# Patient Record
Sex: Female | Born: 1945 | Marital: Single
Health system: Southern US, Community
[De-identification: ages and names within clinical notes are randomized; demographics above are authoritative.]

---

## 2014-02-12 ENCOUNTER — Inpatient Hospital Stay: Payer: Self-pay | Admitting: Internal Medicine

## 2014-02-12 LAB — CBC WITH DIFFERENTIAL/PLATELET
BASOS PCT: 0.5 %
Basophil #: 0 10*3/uL (ref 0.0–0.1)
Eosinophil #: 0.2 10*3/uL (ref 0.0–0.7)
Eosinophil %: 3.1 %
HCT: 34.2 % — AB (ref 35.0–47.0)
HGB: 11.7 g/dL — AB (ref 12.0–16.0)
LYMPHS PCT: 16.9 %
Lymphocyte #: 1 10*3/uL (ref 1.0–3.6)
MCH: 32.2 pg (ref 26.0–34.0)
MCHC: 34 g/dL (ref 32.0–36.0)
MCV: 95 fL (ref 80–100)
MONO ABS: 0.7 x10 3/mm (ref 0.2–0.9)
Monocyte %: 12.6 %
Neutrophil #: 3.8 10*3/uL (ref 1.4–6.5)
Neutrophil %: 66.9 %
PLATELETS: 166 10*3/uL (ref 150–440)
RBC: 3.62 10*6/uL — ABNORMAL LOW (ref 3.80–5.20)
RDW: 16.6 % — ABNORMAL HIGH (ref 11.5–14.5)
WBC: 5.7 10*3/uL (ref 3.6–11.0)

## 2014-02-12 LAB — BODY FLUID CELL COUNT WITH DIFFERENTIAL
BASOS ABS: 0 %
EOS PCT: 0 %
LYMPHS PCT: 16 %
NUCLEATED CELL COUNT: 119 /mm3
Neutrophils: 38 %
OTHER MONONUCLEAR CELLS: 46 %
Other Cells BF: 0 %

## 2014-02-12 LAB — COMPREHENSIVE METABOLIC PANEL
ALK PHOS: 130 U/L — AB
Albumin: 1.1 g/dL — ABNORMAL LOW (ref 3.4–5.0)
Anion Gap: 4 — ABNORMAL LOW (ref 7–16)
BILIRUBIN TOTAL: 1.3 mg/dL — AB (ref 0.2–1.0)
BUN: 7 mg/dL (ref 7–18)
CALCIUM: 7.6 mg/dL — AB (ref 8.5–10.1)
CHLORIDE: 96 mmol/L — AB (ref 98–107)
Co2: 30 mmol/L (ref 21–32)
Creatinine: 0.82 mg/dL (ref 0.60–1.30)
EGFR (African American): >60
Glucose: 77 mg/dL (ref 65–99)
Osmolality: 258 (ref 275–301)
Potassium: 4.4 mmol/L (ref 3.5–5.1)
SGOT(AST): 47 U/L — ABNORMAL HIGH (ref 15–37)
SGPT (ALT): 22 U/L (ref 12–78)
SODIUM: 130 mmol/L — AB (ref 136–145)
Total Protein: 5.6 g/dL — ABNORMAL LOW (ref 6.4–8.2)

## 2014-02-12 LAB — LIPID PANEL
CHOLESTEROL: 207 mg/dL — AB (ref 0–200)
HDL Cholesterol: 26 mg/dL — ABNORMAL LOW (ref 40–60)
Ldl Cholesterol, Calc: 160 mg/dL — ABNORMAL HIGH (ref 0–100)
Triglycerides: 106 mg/dL (ref 0–200)
VLDL Cholesterol, Calc: 21 mg/dL (ref 5–40)

## 2014-02-12 LAB — PROTEIN, BODY FLUID: Protein, Body Fluid: 0.5 g/dL

## 2014-02-12 LAB — TROPONIN I
Troponin-I: <0.02 ng/mL
Troponin-I: <0.02 ng/mL
Troponin-I: <0.02 ng/mL

## 2014-02-12 LAB — APTT: ACTIVATED PTT: 39.2 s — AB (ref 23.6–35.9)

## 2014-02-12 LAB — MAGNESIUM: Magnesium: 2.1 mg/dL

## 2014-02-12 LAB — LACTATE DEHYDROGENASE, PLEURAL OR PERITONEAL FLUID: LDH, BODY FLUID: 29 U/L

## 2014-02-12 LAB — PROTIME-INR
INR: 1.3
Prothrombin Time: 16.1 secs — ABNORMAL HIGH (ref 11.5–14.7)

## 2014-02-12 LAB — LIPASE, BLOOD: LIPASE: 237 U/L (ref 73–393)

## 2014-02-12 LAB — TSH: Thyroid Stimulating Horm: 31.6 u[IU]/mL — ABNORMAL HIGH

## 2014-02-12 LAB — HEMOGLOBIN: HGB: 11.6 g/dL — ABNORMAL LOW (ref 12.0–16.0)

## 2014-02-12 LAB — ALBUMIN, FLUID (OTHER): Body Fluid Albumin: <0.6 g/dL

## 2014-02-12 LAB — AMMONIA: AMMONIA, PLASMA: 55 umol/L — AB (ref 11–32)

## 2014-02-12 LAB — GLUCOSE, SEROUS FLUID: GLUCOSE, BODY FLUID: 87 mg/dL

## 2014-02-13 LAB — COMPREHENSIVE METABOLIC PANEL
ALT: 18 U/L (ref 12–78)
AST: 34 U/L (ref 15–37)
Albumin: 1.3 g/dL — ABNORMAL LOW (ref 3.4–5.0)
Alkaline Phosphatase: 107 U/L
Anion Gap: 2 — ABNORMAL LOW (ref 7–16)
BILIRUBIN TOTAL: 0.9 mg/dL (ref 0.2–1.0)
BUN: 9 mg/dL (ref 7–18)
CREATININE: 0.83 mg/dL (ref 0.60–1.30)
Calcium, Total: 7.6 mg/dL — ABNORMAL LOW (ref 8.5–10.1)
Chloride: 97 mmol/L — ABNORMAL LOW (ref 98–107)
Co2: 31 mmol/L (ref 21–32)
EGFR (African American): >60
EGFR (Non-African Amer.): >60
Glucose: 85 mg/dL (ref 65–99)
OSMOLALITY: 259 (ref 275–301)
Potassium: 4 mmol/L (ref 3.5–5.1)
Sodium: 130 mmol/L — ABNORMAL LOW (ref 136–145)
Total Protein: 4.8 g/dL — ABNORMAL LOW (ref 6.4–8.2)

## 2014-02-13 LAB — CBC WITH DIFFERENTIAL/PLATELET
Basophil #: 0 10*3/uL (ref 0.0–0.1)
Basophil %: 0.3 %
EOS PCT: 4 %
Eosinophil #: 0.2 10*3/uL (ref 0.0–0.7)
HCT: 30.3 % — ABNORMAL LOW (ref 35.0–47.0)
HGB: 10 g/dL — AB (ref 12.0–16.0)
LYMPHS ABS: 0.8 10*3/uL — AB (ref 1.0–3.6)
Lymphocyte %: 19.4 %
MCH: 31.6 pg (ref 26.0–34.0)
MCHC: 33.1 g/dL (ref 32.0–36.0)
MCV: 96 fL (ref 80–100)
MONO ABS: 0.5 x10 3/mm (ref 0.2–0.9)
Monocyte %: 12 %
NEUTROS PCT: 64.3 %
Neutrophil #: 2.7 10*3/uL (ref 1.4–6.5)
Platelet: 127 10*3/uL — ABNORMAL LOW (ref 150–440)
RBC: 3.17 10*6/uL — ABNORMAL LOW (ref 3.80–5.20)
RDW: 17.4 % — ABNORMAL HIGH (ref 11.5–14.5)
WBC: 4.2 10*3/uL (ref 3.6–11.0)

## 2014-02-13 LAB — URINALYSIS, COMPLETE
Bilirubin,UR: NEGATIVE
GLUCOSE, UR: NEGATIVE mg/dL (ref 0–75)
Ketone: NEGATIVE
Nitrite: NEGATIVE
Ph: 6 (ref 4.5–8.0)
Protein: 100
RBC,UR: 10 /HPF (ref 0–5)
Specific Gravity: 1.013 (ref 1.003–1.030)
WBC UR: 24 /HPF (ref 0–5)

## 2014-02-14 LAB — BASIC METABOLIC PANEL
Anion Gap: 4 — ABNORMAL LOW (ref 7–16)
BUN: 9 mg/dL (ref 7–18)
CALCIUM: 7.7 mg/dL — AB (ref 8.5–10.1)
CHLORIDE: 100 mmol/L (ref 98–107)
Co2: 28 mmol/L (ref 21–32)
Creatinine: 0.82 mg/dL (ref 0.60–1.30)
EGFR (Non-African Amer.): >60
Glucose: 75 mg/dL (ref 65–99)
OSMOLALITY: 262 (ref 275–301)
Potassium: 4 mmol/L (ref 3.5–5.1)
Sodium: 132 mmol/L — ABNORMAL LOW (ref 136–145)

## 2014-02-14 LAB — MAGNESIUM: MAGNESIUM: 1.9 mg/dL

## 2014-02-14 LAB — AFP TUMOR MARKER: AFP-Tumor Marker: 0.8 ng/mL (ref 0.0–8.3)

## 2014-02-15 LAB — BASIC METABOLIC PANEL
Anion Gap: 6 — ABNORMAL LOW (ref 7–16)
BUN: 11 mg/dL (ref 7–18)
Calcium, Total: 7.5 mg/dL — ABNORMAL LOW (ref 8.5–10.1)
Chloride: 101 mmol/L (ref 98–107)
Co2: 28 mmol/L (ref 21–32)
Creatinine: 0.87 mg/dL (ref 0.60–1.30)
EGFR (African American): >60
Glucose: 85 mg/dL (ref 65–99)
OSMOLALITY: 269 (ref 275–301)
POTASSIUM: 4 mmol/L (ref 3.5–5.1)
Sodium: 135 mmol/L — ABNORMAL LOW (ref 136–145)

## 2014-02-15 LAB — MAGNESIUM: MAGNESIUM: 1.9 mg/dL

## 2014-02-16 LAB — BODY FLUID CULTURE

## 2014-02-16 LAB — BASIC METABOLIC PANEL
ANION GAP: 5 — AB (ref 7–16)
BUN: 10 mg/dL (ref 7–18)
CALCIUM: 7.2 mg/dL — AB (ref 8.5–10.1)
CREATININE: 0.84 mg/dL (ref 0.60–1.30)
Chloride: 104 mmol/L (ref 98–107)
Co2: 29 mmol/L (ref 21–32)
EGFR (Non-African Amer.): >60
GLUCOSE: 66 mg/dL (ref 65–99)
Osmolality: 273 (ref 275–301)
Potassium: 4 mmol/L (ref 3.5–5.1)
Sodium: 138 mmol/L (ref 136–145)

## 2014-02-19 LAB — CBC WITH DIFFERENTIAL/PLATELET
Basophil #: 0 10*3/uL (ref 0.0–0.1)
Basophil %: 0.7 %
EOS ABS: 0.1 10*3/uL (ref 0.0–0.7)
Eosinophil %: 1.8 %
HCT: 25.3 % — AB (ref 35.0–47.0)
HGB: 8.6 g/dL — ABNORMAL LOW (ref 12.0–16.0)
LYMPHS ABS: 1.2 10*3/uL (ref 1.0–3.6)
Lymphocyte %: 19.5 %
MCH: 32.9 pg (ref 26.0–34.0)
MCHC: 34.2 g/dL (ref 32.0–36.0)
MCV: 96 fL (ref 80–100)
MONO ABS: 0.7 x10 3/mm (ref 0.2–0.9)
Monocyte %: 10.7 %
NEUTROS PCT: 67.3 %
Neutrophil #: 4.2 10*3/uL (ref 1.4–6.5)
Platelet: 128 10*3/uL — ABNORMAL LOW (ref 150–440)
RBC: 2.63 10*6/uL — ABNORMAL LOW (ref 3.80–5.20)
RDW: 17.8 % — ABNORMAL HIGH (ref 11.5–14.5)
WBC: 6.3 10*3/uL (ref 3.6–11.0)

## 2014-02-19 LAB — COMPREHENSIVE METABOLIC PANEL
ALT: 15 U/L (ref 12–78)
Albumin: 2.7 g/dL — ABNORMAL LOW (ref 3.4–5.0)
Alkaline Phosphatase: 68 U/L
Anion Gap: 5 — ABNORMAL LOW (ref 7–16)
BUN: 16 mg/dL (ref 7–18)
Bilirubin,Total: 1.2 mg/dL — ABNORMAL HIGH (ref 0.2–1.0)
CALCIUM: 8 mg/dL — AB (ref 8.5–10.1)
CHLORIDE: 105 mmol/L (ref 98–107)
CO2: 29 mmol/L (ref 21–32)
Creatinine: 1.07 mg/dL (ref 0.60–1.30)
EGFR (African American): >60
GFR CALC NON AF AMER: 54 — AB
Glucose: 90 mg/dL (ref 65–99)
OSMOLALITY: 278 (ref 275–301)
Potassium: 4.5 mmol/L (ref 3.5–5.1)
SGOT(AST): 33 U/L (ref 15–37)
Sodium: 139 mmol/L (ref 136–145)
Total Protein: 4.8 g/dL — ABNORMAL LOW (ref 6.4–8.2)

## 2014-02-19 LAB — HEMOGLOBIN: HGB: 8.1 g/dL — AB (ref 12.0–16.0)

## 2014-02-20 LAB — HEMOGLOBIN: HGB: 8.1 g/dL — ABNORMAL LOW (ref 12.0–16.0)

## 2015-01-31 NOTE — Consult Note (Signed)
Chief Complaint:  Subjective/Chief Complaint seen for cirrhosis.  stable, s/p 2 liter paracentesis today.   denies n.v, tolerating regular po. brown stools recorded.   VITAL SIGNS/ANCILLARY NOTES: **Vital Signs.:   08-May-15 14:25  Vital Signs Type Routine  Temperature Temperature (F) 98  Celsius 36.6  Pulse Pulse 59  Respirations Respirations 20  Systolic BP Systolic BP 84  Diastolic BP (mmHg) Diastolic BP (mmHg) 52  Mean BP 62  Pulse Ox % Pulse Ox % 98  Pulse Ox Activity Level  At rest  Oxygen Delivery Room Air/ 21 %  *Intake and Output.:   08-May-15 14:30  Stool  1 brown formed moderate amount   Brief Assessment:  Cardiac Regular   Respiratory clear BS   Gastrointestinal details normal Soft  Nontender  Bowel sounds normal  positive ascites   Lab Results: Routine Chem:  08-May-15 04:37   Glucose, Serum 75  BUN 9  Creatinine (comp) 0.82  Sodium, Serum  132  Potassium, Serum 4.0  Chloride, Serum 100  CO2, Serum 28  Calcium (Total), Serum  7.7  Anion Gap  4  Osmolality (calc) 262  eGFR (African American) >60  eGFR (Non-African American) >60 (eGFR values <28m/min/1.73 m2 may be an indication of chronic kidney disease (CKD). Calculated eGFR is useful in patients with stable renal function. The eGFR calculation will not be reliable in acutely ill patients when serum creatinine is changing rapidly. It is not useful in  patients on dialysis. The eGFR calculation may not be applicable to patients at the low and high extremes of body sizes, pregnant women, and vegetarians.)  Magnesium, Serum 1.9 (1.8-2.4 THERAPEUTIC RANGE: 4-7 mg/dL TOXIC: > 10 mg/dL  -----------------------)   Radiology Results: [place]    08-May-15 16:14, [place]  [place]acent[place]   REASON FOR EXAM:    ascites  COMMENTS:       PROCEDURE: [place] - [place]UIDED PARACENTESIS  - Feb 14 2014  4:14PM     INDICATION:  Recurrent symptomatic ascites    EXAM:  ULTRASOUND-GUIDED  PARACENTESIS    COMPARISON:  [place]DOMEN COMPLETE dated 02/12/2014; USGUIDE NEEDLE -  [place]PARA dated 02/12/2014    MEDICATIONS:  None.    COMPLICATIONS:  None immediate    TECHNIQUE:  Informed written consent was obtained from the patient (via the use  of a medical translator) after a discussion of the risks, benefits  and alternatives to treatment. A timeout was performed prior to the  initiation of the procedure.    Initial ultrasound scanning demonstrates a small amount of ascites  within the right lower abdominal quadrant. The right lower abdomen  was prepped and draped in the usual sterile fashion. 1% lidocaine  with epinephrine was used for local anesthesia. An ultrasound image  was saved for documentation purposed. An 8 Fr Safe-T-Centesis  catheter was introduced. The paracentesis was performed. The  catheter was removed and a dressing was applied. The patient  tolerated the procedure well without immediate post procedural  complication.    FINDINGS:  A total of approximately 2 liters of serous fluid was removed.     IMPRESSION:  Successful ultrasound-guided paracentesis yielding 2 liters of  peritoneal fluid.      Electronically Signed    By: JSandi MariscalM.D.    On: 02/14/2014 16:19         Verified By: JAileen Fass M.D.,  Cardiology:    07-May-15 18:54, Echo Doppler  Echo Doppler  REASON FOR EXAM:      COMMENTS:       PROCEDURE: [place] - ECHO DOPPLER COMPLETE(TRANSTHOR)  - Feb 13 2014  6:54PM     RESULT: Echocardiogram Report    Patient [facility]:   [facility] Date of Exam: 02/13/2014  Medical Rec #:  [id number]                       Custom1:  Date of Birth:  [birth year]                          Height:       61.0 in  Patient Age:    69 years                           Weight:       179.0 lb  Patient Gender: F                                  BSA:          1.80 m??    Indications: SOB  Sonographer:    Arville Go RDCS  Referring Phys:  Ether Griffins, [place]    Summary:   1. Left ventricular ejection fraction, by visual estimation, is 60 to   65%.   2. Mildly dilated left atrium.   3. Moderate mitral valve regurgitation.   4. Mild tricuspid regurgitation.  2D AND M-MODE MEASUREMENTS (normal ranges within parentheses):  Left Ventricle:          Normal  IVSd (2D):      0.74 cm (0.7-1.1)  LVPWd (2D):     0.88 cm (0.7-1.1) Aorta/LA:                  Normal  LVIDd (2D):     4.97 cm (3.4-5.7) Aortic Root (2D): 2.90 cm (2.4-3.7)  LVIDs (2D):     3.08 cm           Left Atrium (2D): 3.60 cm (1.9-4.0)  LV FS (2D):     38.0 %   (>25%)  LV EF (2D):     68.0 %   (>50%)                                    Right Ventricle:                                    RVd (2D):  LV DIASTOLIC FUNCTION:  MV Peak E: 1.11 m/s Decel Time: 454 msec  MV Peak A: 0.95 m/s  E/A Ratio: 1.17  SPECTRAL DOPPLER ANALYSIS (where applicable):  Mitral Valve:  MV P1/2 Time: 131.66 msec  MV Area,PHT: 1.67 cm??  Aortic Valve: AoV Max Vel: 2.04 m/s AoV Peak PG: 16.6 mmHg AoV Mean PG:  LVOT Vmax: 1.47 m/s LVOT VTI:  LVOT Diameter: 2.00 cm  AoV Area, Vmax: 2.26 cm?? AoV Area, VTI:  AoV Area, Vmn:  Tricuspid Valve and PA/RV Systolic Pressure: TR Max Velocity: 2.38 m/s RA     Pressure: 5 mmHg RVSP/PASP: 27.7 mmHg  Pulmonic Valve:  PV Max Velocity: 1.44 m/s PV Max PG: 8.3 mmHg PV Mean PG:  PHYSICIAN INTERPRETATION:  Left Ventricle: The left ventricular internal cavity size was normal. LV   posterior wall thickness was normal. Left ventricular ejection fraction,   by visual estimation, is 60 to 65%.  Right Ventricle: Normal right ventricular size, wall thickness, and   systolic function. RV wall thickness is normal.  Left Atrium: The left atrium is mildly dilated.  Right Atrium: The right atrium is normal in size and structure.  Pericardium: There is no evidence of pericardial effusion.  Mitral Valve: Moderate mitral valve regurgitation is seen.  Tricuspid Valve:  Mild tricuspid regurgitation is visualized. The     tricuspid regurgitant velocity is 2.38 m/s, and with an assumed right   atrial pressure of 5 mmHg, the estimated right ventricular systolic   pressure is normal at 27.7 mmHg.  Aortic Valve: The aortic valve is trileaflet and structurally normal,   with normal leaflet excursion; without any evidence of aortic stenosis or   insufficiency.  Pulmonic Valve: Structurally normal pulmonic valve, with normal leaflet   excursion.  Aorta: The aortic root and ascending aorta are structurally normal, with   no evidence of dilitation.    [id number] Serafina Royals MD  Electronically signed by [id number] Serafina Royals MD  Signature Date/Time: 02/14/2014/4:15:19 PM  *** Final ***    IMPRESSION: .        Verified By: Corey Skains  ([facility]), M.D., MD   Assessment/Plan:  Assessment/Plan:  Assessment 1) cirrhosis with mild coagulopathy hypoalbuminemia, mild increase bili, mild hyperammonemia. Ardine Eng pugh class b-c.  uncertain etiology.  continues with hypoalbuminemia, contributing to paripheral edema and ascites.  Increase weight since admission.  New history indicates she was told in [place] that her liver disease was due to medicines, but she is uncertain what.  Evaluation here otherwise negative.   Plan 1) continue current, may need to limit fluids to 1/5-2 liters per day.  increase diuretices slowly to achieve 1.5 to 2 # weight loss a day.  ct abd to be done over the weekend. Dr Vira Agar covering until monday.   Electronic Signatures: Loistine Simas (MD)  (Signed [phone] 17:36)  Authored: Chief Complaint, VITAL SIGNS/ANCILLARY NOTES, Brief Assessment, Lab Results, Radiology Results, Assessment/Plan   Last Updated: 08-May-15 17:36 by Loistine Simas (MD)

## 2015-01-31 NOTE — Consult Note (Signed)
Pt complains of vomiting this morning after getting 4 oral meds. Nurse confirms this.  Will give iv Zofran before morning meds starting tomorrow.  Pt with abd with ascites and leg edema, will bump her lasix up to 40 mg bid and check daily weights. Alb 1.3  Complains of abd pain and swelling. BP runs low, afebrile. Pt CC is cirrhosis etiology unknown.  Electronic Signatures: Scot JunElliott, Kevon Tench T (MD)  (Signed on 09-May-15 14:19)  Authored  Last Updated: 09-May-15 14:19 by Scot JunElliott, Christyanna Mckeon T (MD)

## 2015-01-31 NOTE — Consult Note (Signed)
Patient had drop in BP during night, possible due to bump in diuretics of lasix to bid.  lasix and aldactone on hold.  BP back up to 93 systolic.  Still with trace leg edema.  Hospitalist to bump up midodrine.  No further recommendations.  Electronic Signatures: Scot JunElliott, Robert T (MD)  (Signed on 10-May-15 13:19)  Authored  Last Updated: 10-May-15 13:19 by Scot JunElliott, Robert T (MD)

## 2015-01-31 NOTE — Consult Note (Signed)
Chief Complaint:  Subjective/Chief Complaint seen for cirrhosis, ascites. Patietn c/o pain on the flanks mostly right.  no n/v, tolerating po.   VITAL SIGNS/ANCILLARY NOTES: **Vital Signs.:   11-May-15 09:12  Vital Signs Type Routine  Temperature Temperature (F) 98.5  Celsius 36.9  Temperature Source oral  Pulse Pulse 64  Respirations Respirations 20  Systolic BP Systolic BP 92  Diastolic BP (mmHg) Diastolic BP (mmHg) 54  Mean BP 66  Pulse Ox % Pulse Ox % 94  Pulse Ox Activity Level  At rest   Brief Assessment:  Cardiac Regular   Respiratory clear BS   Gastrointestinal details normal Bowel sounds normal  positive ascites,  mild lateral abdominal discomfort   Lab Results: Routine Chem:  10-May-15 05:49   Glucose, Serum 66  BUN 10  Creatinine (comp) 0.84  Sodium, Serum 138  Potassium, Serum 4.0  Chloride, Serum 104  CO2, Serum 29  Calcium (Total), Serum  7.2  Anion Gap  5  Osmolality (calc) 273  eGFR (African American) >60  eGFR (Non-African American) >60 (eGFR values <23m/min/1.73 m2 may be an indication of chronic kidney disease (CKD). Calculated eGFR is useful in patients with stable renal function. The eGFR calculation will not be reliable in acutely ill patients when serum creatinine is changing rapidly. It is not useful in  patients on dialysis. The eGFR calculation may not be applicable to patients at the low and high extremes of body sizes, pregnant women, and vegetarians.)   Radiology Results: CT:    09-May-15 15:22, CT Abdomen and Pelvis With Contrast  CT Abdomen and Pelvis With Contrast   REASON FOR EXAM:    (1) abdominal pain; (2) abdominal pain  COMMENTS:       PROCEDURE: CT  - CT ABDOMEN / PELVIS  W  - Feb 15 2014  3:22PM     CLINICAL DATA:  Abdominal pain and tenderness. Ascites. Recent  hernia repair.    EXAM:  CT ABDOMEN AND PELVIS WITH CONTRAST    TECHNIQUE:  Multidetector CT imaging of the abdomen and pelvis was performed  using the  standard protocol following bolus administration of  intravenous contrast.  CONTRAST:  100 mL Isovue 300    COMPARISON:  Ultrasound on 02/12/2014    FINDINGS:  Hepatic cirrhosis demonstrated, however no liver masses are  identified. Prominent esophageal varices are seen, consistent with  portal venous hypertension. No evidence of splenomegaly. Moderate  ascites is seen as well as diffuse body wall edema.    Calcified gallstones are noted, however there is no evidence of  acute cholecystitis or biliary dilatation. The pancreas, spleen,  adrenal glands, and kidneys are normal in appearance. No evidence of  hydronephrosis.  Uterus and adnexal regions are unremarkable. No soft tissue masses  or lymphadenopathy identified within the abdomen or pelvis. No  evidence of focal inflammatory process or abscess. No evidence of  dilated bowel loops.     IMPRESSION:  Hepatic cirrhosis with prominent esophageal varices, consistent with  portal venous hypertension.    Moderate ascites and diffuse body wall edema. No evidence of  splenomegaly.    Cholelithiasis.  No radiographic evidence of cholecystitis.    No soft tissue masses or lymphadenopathy identified within the  abdomen or pelvis.      Electronically Signed    By: JEarle GellM.D.    On: 02/15/2014 15:28         Verified By: JMarlaine Hind M.D.,   Assessment/Plan:  Assessment/Plan:  Assessment 1) ESLDz- cirrhosis, uncertain etiology, per patietn she has been told it is due to previous medications/herbals in [place].  Evaluation for etiology here uninformative. Patietn with esophageal varices per CT, ascites mild coagulopathy, mild hyperammonemia, hypoalbuminemia.  Ascites, now s/p paracentesis times 2.   Plan 1) continue current, recommend 2 liter daily fluid restriction, continue daily weights and strict I/O, continue spironolactone and lasix combination (hypotension noted over the weekend with second dose of lasix).   diuresis to attain 1.5 # loss daily in the hospital.  continue cipro prophylaxis for sbp twice a week.  As the bp is low, prophylaxis for variceal bleeding is more problematic.   Electronic Signatures: Loistine Simas (MD)  (Signed 11-May-15 17:08)  Authored: Chief Complaint, VITAL SIGNS/ANCILLARY NOTES, Brief Assessment, Lab Results, Radiology Results, Assessment/Plan   Last Updated: 11-May-15 17:08 by Loistine Simas (MD)

## 2015-01-31 NOTE — Consult Note (Signed)
Brief Consult Note: Diagnosis: cirrhosis.   Patient was seen by consultant.   Consult note dictated.   Recommend further assessment or treatment.   Orders entered.   Comments: Please see full GI consult [phone]#[id number]. Pateitn seen and examined, chart reviewed.  Patient presenting with ascites and probable cirrhosis per ultrasound.  Positive h/o diagnosis outside if the country.  Multiple labs ordered, also recommend echo for valvular assessment and consider ct abd with contrast as clinically feasible.  Following.  Electronic Signatures: Barnetta ChapelSkulskie, Jillene Wehrenberg (MD)  (Signed 06-May-15 20:54)  Authored: Brief Consult Note   Last Updated: 06-May-15 20:54 by Barnetta ChapelSkulskie, Darcee Dekker (MD)

## 2015-01-31 NOTE — Consult Note (Signed)
Chief Complaint:  Subjective/Chief Complaint seen for cirrhosis, patient feeling better, no nausea, deneis emesis for 4 days. continues with mild flank pain, mostly right side, but also improving.   VITAL SIGNS/ANCILLARY NOTES: **Vital Signs.:   12-May-15 13:44  Vital Signs Type Routine  Temperature Temperature (F) 97.9  Celsius 36.6  Pulse Pulse 59  Respirations Respirations 20  Systolic BP Systolic BP 94  Diastolic BP (mmHg) Diastolic BP (mmHg) 61  Mean BP 72  Pulse Ox % Pulse Ox % 94  Pulse Ox Activity Level  At rest  Oxygen Delivery Room Air/ 21 %  *Intake and Output.:   12-May-15 12:23  Current Weight (lbs) (lbs) 182   Brief Assessment:  Cardiac Regular   Respiratory clear BS   Gastrointestinal details normal Soft  Bowel sounds normal  No rebound tenderness  mild right flank tenderness, mild ruq discomfort.   EXTR positive edema, 2+ lee   Lab Results: Routine Chem:  10-May-15 05:49   Glucose, Serum 66  BUN 10  Creatinine (comp) 0.84  Sodium, Serum 138  Potassium, Serum 4.0  Chloride, Serum 104  CO2, Serum 29  Calcium (Total), Serum  7.2  Anion Gap  5  Osmolality (calc) 273  eGFR (African American) >60  eGFR (Non-African American) >60 (eGFR values <47m/min/1.73 m2 may be an indication of chronic kidney disease (CKD). Calculated eGFR is useful in patients with stable renal function. The eGFR calculation will not be reliable in acutely ill patients when serum creatinine is changing rapidly. It is not useful in  patients on dialysis. 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  Evaluation otherwise negative. stable, ascites improving slowly, improvement of LEE.   Plan 1) continue current diuretics, daily weights, strict I/O.  continue cipro 500 mg po twice a week, may be  able to substitute lactulose for xifaxan.  will recheck labs in am.  Discussed with Dr SDarvin Neighbours   Electronic Signatures: SLoistine Simas(MD)  (Signed 12-May-15 17:08)  Authored: Chief Complaint, VITAL SIGNS/ANCILLARY NOTES, Brief Assessment, Lab Results, Assessment/Plan   Last Updated: 12-May-15 17:08 by SLoistine Simas(MD)

## 2015-01-31 NOTE — Consult Note (Signed)
Chief Complaint:  Subjective/Chief Complaint seen for cirrhosis.  she reports emesis of medicines last night.  When she descrtibes the emesis it is clear/frothy as seen in the emesis basin at bedside.  she denies blood or black in the emesis, no apparent food materials. no abdominal pain. had a small bm last night, not black or red, not recorded.  Patient is not telling nursing about her trips to the bathroom and I/O are not being kept.   VITAL SIGNS/ANCILLARY NOTES: **Vital Signs.:   15-May-15 04:30  Temperature Temperature (F) 98.5  Celsius 36.9  Temperature Source oral  Pulse Pulse 62  Respirations Respirations 20  Systolic BP Systolic BP 104  Diastolic BP (mmHg) Diastolic BP (mmHg) 66  Mean BP 78  Pulse Ox % Pulse Ox % 93  Pulse Ox Activity Level  At rest  Oxygen Delivery Room Air/ 21 %   Brief Assessment:  Cardiac Regular   Respiratory clear BS   Gastrointestinal details normal nontender, bs positive, moderate ascites., mild fluid wave.   Lab Results:  General Ref:  13-May-15 04:22   Helicobacter pylori AB. IgG, IgA, IgM ========== TEST NAME ==========  ========= RESULTS =========  = REFERENCE RANGE =  HELICOBACTER P. AB PANEL  H pylori, IgM, IgG, IgA Ab H. pylori, IgG Abs              [   <0.9 U/mL            ]           0.0-0.8             Negative            <0.9                                             Indeterminate  0.9 - 1.0                                             Positive            >1.0               [facility]            No: [id number] [address], [place], [place] [id number]           Mila HomerWilliam F Hancock, MD         [phone]   Result(s) reported on 20 Feb 2014 at 04:18PM.  Routine Hem:  14-May-15 07:52   Hemoglobin (CBC)  8.1 (Result(s) reported on 20 Feb 2014 at 08:23AM.)   Assessment/Plan:  Assessment/Plan:  Assessment 1) cirrhosis ude. 2) nausea,possible expectoration of phlegm material.  If nausea continues would do a ugi  series for further evaluation before consideration of egd.   Plan 1) continue current.  seems to be tolerating clears, advance diet to full liquids to low residue as tolerated. discussed with Dr Elpidio AnisSudini.   2) I have also discussed with nursing the need for appropriate I/O and daily weights.  Dr Bluford Kaufmannh covering this weekend, call if needed.   Electronic Signatures: Barnetta ChapelSkulskie, Gabrielle Wakeland (MD)  (Signed [phone]-May-15 08:46)  Authored: Chief Complaint, VITAL SIGNS/ANCILLARY NOTES, Brief Assessment, Lab Results, Assessment/Plan   Last Updated: 15-May-15 08:46 by Barnetta ChapelSkulskie, Siegfried Vieth (  MD) 

## 2015-01-31 NOTE — Consult Note (Signed)
Chief Complaint:  Subjective/Chief Complaint seen for cirrhosis, c/o flank and lower abdominal discomfort. c/o urinating too much.  no nausea or emesis.   VITAL SIGNS/ANCILLARY NOTES: **Vital Signs.:   07-May-15 08:46  Vital Signs Type Routine  Temperature Temperature (F) 99.1  Celsius 37.2  Temperature Source oral  Pulse Pulse 85  Pulse source if not from Vital Sign Device per Telemetry Clerk  Respirations Respirations 18  Systolic BP Systolic BP 696  Diastolic BP (mmHg) Diastolic BP (mmHg) 61  Mean BP 74  Pulse Ox % Pulse Ox % 94  Pulse Ox Activity Level  At rest  Oxygen Delivery Room Air/ 21 %  Telemetry pattern Cardiac Rhythm Normal sinus rhythm; pattern reported by Telemetry Clerk; 85  *Intake and Output.:   07-May-15 08:37  Stool  Patient had a small loose bowel movement.   Brief Assessment:  Cardiac Regular   Respiratory clear BS   Gastrointestinal details normal Soft  Bowel sounds normal  No rebound tenderness  moderate to large amount of ascites, mild tender to palpation low abdomen and flanks.   EXTR positive edema   Lab Results: Hepatic:  07-May-15 04:14   Bilirubin, Total 0.9  Alkaline Phosphatase 107 (45-117 NOTE: New Reference Range 08/30/13)  SGPT (ALT) 18  SGOT (AST) 34  Total Protein, Serum  4.8  Albumin, Serum  1.3  Routine Chem:  07-May-15 04:14   Glucose, Serum 85  BUN 9  Creatinine (comp) 0.83  Sodium, Serum  130  Potassium, Serum 4.0  Chloride, Serum  97  CO2, Serum 31  Calcium (Total), Serum  7.6  Osmolality (calc) 259  eGFR (African American) >60  eGFR (Non-African American) >60 (eGFR values <69m/min/1.73 m2 may be an indication of chronic kidney disease (CKD). Calculated eGFR is useful in patients with stable renal function. The eGFR calculation will not be reliable in acutely ill patients when serum creatinine is changing rapidly. It is not useful in  patients on dialysis. The eGFR calculation may not be applicable to patients  at the low and high extremes of body sizes, pregnant women, and vegetarians.)  Anion Gap  2  Routine UA:  07-May-15 05:31   Color (UA) Amber  Clarity (UA) Cloudy  Glucose (UA) Negative  Bilirubin (UA) Negative  Ketones (UA) Negative  Specific Gravity (UA) 1.013  Blood (UA) 3+  pH (UA) 6.0  Protein (UA) 100 mg/dL  Nitrite (UA) Negative  Leukocyte Esterase (UA) Trace (Result(s) reported on 13 Feb 2014 at 06:56AM.)  RBC (UA) 10 /HPF  WBC (UA) 24 /HPF  Bacteria (UA) 3+  Epithelial Cells (UA) 6 /HPF  Mucous (UA) PRESENT (Result(s) reported on 13 Feb 2014 at 06:56AM.)  Routine Hem:  07-May-15 04:14   WBC (CBC) 4.2  RBC (CBC)  3.17  Hemoglobin (CBC)  10.0  Hematocrit (CBC)  30.3  Platelet Count (CBC)  127  MCV 96  MCH 31.6  MCHC 33.1  RDW  17.4  Neutrophil % 64.3  Lymphocyte % 19.4  Monocyte % 12.0  Eosinophil % 4.0  Basophil % 0.3  Neutrophil # 2.7  Lymphocyte #  0.8  Monocyte # 0.5  Eosinophil # 0.2  Basophil # 0.0 (Result(s) reported on 13 Feb 2014 at 05:35AM.)   Assessment/Plan:  Assessment/Plan:  Assessment 1) cirrhosis, mild coagulopathy hypoalbuminemia, mild increase bili, mild hyperammonemia. CArdine Engpugh class b-c.  uncertain etiology 2) flank and lower abdominal pain, no dysuria.  likely  from ascites with peritoneal "stretch"   Plan 1) daily weights, strict  I/O, limit fluid po to 1.5 l per day.  2) will order alpha fetoprotein for baseline. 3) sbp prophylaxis ordered 4) recommend CT abdomen or pelvic US and echocardiogram 5) continue current diuretics and daily met b/lfts   Electronic Signatures: Loistine Simas (MD)  (Signed 07-May-15 11:24)  Authored: Chief Complaint, VITAL SIGNS/ANCILLARY NOTES, Brief Assessment, Lab Results, Assessment/Plan   Last Updated: 07-May-15 11:24 by Loistine Simas (MD)

## 2015-01-31 NOTE — Consult Note (Signed)
Brief Consult Note: Diagnosis: cirrhosis childs pugh class C.   Patient was seen by consultant.   Recommend further assessment or treatment.   Discussed with Attending MD.   Comments: Patient seen adn examined, full consult to follow.  Patient presenting with ascites and lower extremity edema. Evaluation and labs consistant with cirrhosis.  Etiology uncertain.  Patietrn diagnosed with cirrhosis about u8 months ago, but patient not aware of etiology, and I am uncertain as to what further evaluation was done.  Further recs to follow.  Electronic Signatures: Barnetta ChapelSkulskie, Martin (MD)  (Signed 06-May-15 19:01)  Authored: Brief Consult Note   Last Updated: 06-May-15 19:01 by Barnetta ChapelSkulskie, Martin (MD)

## 2015-01-31 NOTE — Consult Note (Signed)
PATIENT NAME:  Emily Reese, YIN MR#:  [id number] DATE OF BIRTH:  Sep 05, 1946  DATE OF CONSULTATION:  02/12/2014  REFERRING PHYSICIAN:  Theodoro Grist, MD CONSULTING PHYSICIAN:  Lollie Sails, MD   REASON FOR CONSULTATION: Ascites, liver cirrhosis per history. No history of alcohol abuse.   HISTORY OF PRESENT ILLNESS: Ms. Emily Reese is a 69 year old Hispanic female. Medical information is obtained from the patient and her daughter through interpreter. The patient states that presented to the hospital with worsening abdominal distention, as well as peripheral edema. She states her abdomen and her feet began swelling about 8 months ago, and she was told at that time in [place] that she had liver disease. She is uncertain of the etiology and does not recall being told a particular etiology. She was apparently evaluated in [place] about 3 months or so ago. She does relate having some nausea and vomiting intermittently for about a month. She states she is feeling some better from this since her hospitalization. She denies any abdominal pain, however, on further questions, she does have a definite flank pain to both sides and areas of edema in the skin. There is no dysphagia or heartburn. She states she has loose/diarrhea stools, about 4 to 5 times a day. She sees no blood in the stools, however, has been seeing some black stools. She was placed on iron apparently several weeks ago, and I believe this is likely related.   LIVER HISTORY:  No exposure to hepatic toxins. No family history of liver disease that she is aware of. Remote alcohol use, but never significant. She has no tattoos. No history of intravenous drug use. No transfusions. No incarceration. She did have an ultrasound on admission showing small nodular cirrhotic-appearing liver, as well as moderate ascites and some gallstones and echogenic sludge in the gallbladder. She has undergone a diagnostic paracentesis, small  volume.   PAST MEDICAL HISTORY: 1.  Liver cirrhosis as above.  2.  Renal insufficiency.  3.  Hypothyroidism.  4.  Anemia.  5.  Some type of abdominal surgery for which she also underwent a placement of a mesh. She could not tell me more information about this. The scar on the abdomen is to the right of the umbilicus and extending toward the pelvis, rather irregular.   SOCIAL HISTORY: The patient has 3 children. She is a widow.   REVIEW OF SYSTEMS:  Ten systems reviewed per admission history and physical, agree with same.   PHYSICAL EXAMINATION: VITAL SIGNS: Temperature is 97.6, pulse 58, respirations 18, blood pressure 105/68, pulse ox 96%.  GENERAL: She is an elderly-appearing Hispanic female intermittently coughing. No acute distress.  HEENT: Normocephalic, atraumatic.  Eyes are anicteric. Nose: Septum midline. Oropharynx: No lesions.  NECK: No JVD.  HEART: Regular rate and rhythm.  LUNGS: Clear.  ABDOMEN: Protuberant. Moderate ascites with a positive fluid wave. She is nontender in the abdomen with the exception of the flanks as noted above. She does have some amount of anasarca in her lower extremities extending upward to the mid back posteriorly in recumbent position. Bowel sounds are positive. RECTAL: Anorectal exam deferred.  NEUROLOGICAL: Cranial nerves II through XII grossly intact. Muscle strength bilaterally equal and symmetric.   LABORATORY DATA: Include the following: On admission to the hospital, she had a glucose of 77, BUN 7, creatinine 0.82, sodium 130, potassium 4.4, chloride 96, bicarb 30, osmolality 258, calcium 7.6, magnesium 2.1, cholesterol 207, triglycerides 106, HDL 26, lipase 27. Total protein 5.6,  albumin 1.1, total bilirubin 1.3, alk phos 130, AST 47, ALT 22. Troponin I x 3 negative, less than 0.02. TSH was elevated at 31.6. Hemogram showing a white count of 5.7, H and H 11.7/34.2, platelet count of 166. Her pro time was 16.1, INR of 1.3. She had a diagnostic  paracentesis, small volume, showing a clear peritoneal fluid, noninfected. The albumin was less than 0.6 and protein 0.5. She had an ammonia of 55.   IMAGING:  Includes a portable chest film showing low lung volumes, likely secondary to her ascites and no active lung disease. Her abdominal ultrasound showed a small  liver heterogenous echotexture nodular capsule suggestive of cirrhosis. No focal liver mass. The common bile duct was normal at 2.8 mm, thickened gallbladder wall with gallstones, echogenic bile suggestive of sludge. Findings consistent with cirrhosis and ascites. She had the ultrasound-guided paracentesis of a total of 6.5 liters of clear fluid being removed. It is of note that she was given some albumin after the procedure. She had Dopplers of the bilateral lower extremities with no evidence of DVT.   ASSESSMENT: 1.  Cirrhosis of the liver of uncertain etiology. This was apparently diagnosed in [place]. The patient does not have any idea what the etiology may be, and I am uncertain as to whether that was evaluated. She presents as a Child-Pugh class B to C cirrhotic with ascites, hypoalbuminemia, mild bilirubin elevation and imaging consistent with cirrhosis of the liver. Further, she does have a mild hyperammonemia. However, she does have a normal platelet count, mild coagulopathy with an INR of 1.3. The patient was apparently on combination of Lasix and Aldactone as an outpatient, as well as iron.   RECOMMENDATIONS: 1.  Chronic hepatitis serologies.  2.  ANA, AMA, ASMA.  3. Consider abdominal imaging via CT scanning.  4.  Echocardiogram.  5.  We will add Lasix to daily regimen.  6.  Continue spironolactone as you are.  7.  We will place her on a prophylaxis for SBP while she is in the hospital. She should probably continue that once a week or twice a week as an outpatient.  8. We will further start her on a dose of Xifaxan 550 mg twice a day in regards to the hyperammonemia. She  currently has some loose stools and may not be properly responsive to lactulose.    ____________________________ Lollie Sails, MD mus:dmm D: 02/12/2014 20:46:13 ET T: 02/12/2014 22:48:04 ET JOB#: [id number]  cc: Lollie Sails, MD, <Dictator> Lollie Sails MD ELECTRONICALLY SIGNED 03/04/2014 10:18

## 2015-01-31 NOTE — Consult Note (Signed)
Chief Complaint:  Subjective/Chief Complaint nausea this am with small amount of emesis of frothy material.  denies abdominal pain, even the discomfort on the right flank is getting better.  had a small bm no blood or black per patient.  not recorded.   VITAL SIGNS/ANCILLARY NOTES: **Vital Signs.:   14-May-15 13:57  Vital Signs Type Routine  Temperature Temperature (F) 98.2  Celsius 36.7  Temperature Source oral  Pulse Pulse 55  Respirations Respirations 19  Systolic BP Systolic BP 118  Diastolic BP (mmHg) Diastolic BP (mmHg) 73  Mean BP 88  Pulse Ox % Pulse Ox % 92  Pulse Ox Activity Level  At rest  Oxygen Delivery Room Air/ 21 %   Brief Assessment:  Cardiac Regular   Respiratory clear BS   Gastrointestinal details normal No rebound tenderness  No rigidity  positive ascites, nontender bs positive   Lab Results: General Ref:  13-May-15 04:22   Helicobacter pylori AB. IgG, IgA, IgM ========== TEST NAME ==========  ========= RESULTS =========  = REFERENCE RANGE =  HELICOBACTER P. AB PANEL  H pylori, IgM, IgG, IgA Ab H. pylori, IgG Abs              [   <0.9 U/mL            ]           0.0-0.8             Negative            <0.9                                             Indeterminate  0.9 - 1.0                                             Positive            >1.0               [facility]            No: [id number] [address], [place], [place] [address]           Mila Homer, MD         [phone]   Result(s) reported on 20 Feb 2014 at 04:18PM.  Routine Hem:  06-May-15 09:18   Hemoglobin (CBC)  11.7    11:35   Hemoglobin (CBC)  11.6 (Result(s) reported on 12 Feb 2014 at 11:54AM.)  07-May-15 04:14   Hemoglobin (CBC)  10.0  13-May-15 04:22   Hemoglobin (CBC)  8.6    16:08   Hemoglobin (CBC)  8.1 (Result(s) reported on 19 Feb 2014 at 04:44PM.)  14-May-15 07:52   Hemoglobin (CBC)  8.1 (Result(s) reported on 20 Feb 2014 at 08:23AM.)    Assessment/Plan:  Assessment/Plan:  Assessment 1) cirrhosis UDE, possible medicaltion related.  improved with some lessening of ascites, less abdominal pain, labs improved.   Plan 1) continue current meds.  she will be able to do a follow up at [facility] about 2 weeks after d/c, but longitudinal follow up will need to be at local clinic.  The other goal is to help her to get an appointment at [facility] hepatology, where further questions may be  answered.  I, through the interpreter, tried to explain how the process of getting a transplant works, as there is a near compleete misunderstanding about this.  She and her family are apparently under the impression that a family member can donate part of a live and that is all it takes.  I tried to inform her as to expectations.   Her current calculated meld score is 11, quite a ways from 20-25 usually seen as transplant.  I have told patient that she may be able to get better information [facility]. Will restart clears and allow some sherbert, if tolerating tonight, trial full liquids tomorrow.  Agree with reglan, following.   Electronic Signatures: Barnetta ChapelSkulskie, Alizia Greif (MD)  (Signed 14-May-15 17:28)  Authored: Chief Complaint, VITAL SIGNS/ANCILLARY NOTES, Brief Assessment, Lab Results, Assessment/Plan   Last Updated: 14-May-15 17:28 by Barnetta ChapelSkulskie, Nevaeha Finerty (MD)

## 2015-01-31 NOTE — Discharge Summary (Signed)
Dates of Admission and Diagnosis:  Date of Admission 12-Feb-2014   Date of Discharge 21-Feb-2014   Admitting Diagnosis Ascitis   Final Diagnosis 1.Liver cirrhosis, unknown etiology 2. Ascitis 3. Esophageal varices 4. AOCD 5. VOmiting 6. Malnutrition, severe 7. Hypothyroidism 8. Pancytopenia    Chief Complaint/History of Present Illness PRIMARY CARE PHYSICIAN:  None.  PRIMARY CARE PHYSICIAN: The patient is a 69 year old Spanish female who just came from of [place] with worsening abdominal distention. Apparently, the patient was diagnosed with liver cirrhosis in [place] approximately 8 months ago; however, now over the past 8 months, she has been having problems with worsening lower extremity swelling, as well as abdominal distention. She also is complaining of chest pain for the past few months. The patient describes this pain as midsternal discomfort, constant, which increases especially at nighttime accompanied by shortness of breath. On arrival to the Emergency Room, she was noted to have a distended abdomen, very tense, ascitics, and hospitalist services were contacted for admission.   Allergies:  No Known Allergies:   Hepatic:  13-May-15 04:22   Bilirubin, Total  1.2  Alkaline Phosphatase 68 (45-117 NOTE: New Reference Range 08/30/13)  SGPT (ALT) 15  SGOT (AST) 33  Total Protein, Serum  4.8  Albumin, Serum  2.7  General Ref:  [id number] 92:44   Helicobacter pylori AB. IgG, IgA, IgM ========== TEST NAME ==========  ========= RESULTS =========  = REFERENCE RANGE =  HELICOBACTER P. AB PANEL  H pylori, IgM, IgG, IgA Ab H. pylori, IgG Abs              [   <0.9 U/mL            ]           0.0-0.8             Negative            <0.9                                             Indeterminate  0.9 - 1.0                                             Positive            >1.0 H. pylori, IgA Abs              [   <9.0 units           ]           0.0-8.9                                                 Negative          <9.0                                                Equivocal   9.0 - 11.0  Positive         >11.0 H. pylori, IgM Abs              [   <9.0 units           ]           0.0-8.9                                                Negative          <9.0                                                Equivocal   9.0 - 11.0                                                Positive>11.0                                                                      .                This test was developed and its performance                characteristics determined by [facility]. It has not been                cleared or approved by the Food and Drug Administration.                Results of this test are for investigational purposes                only. The result should not be used as a diagnostic                procedure without confirmation of the diagnosis by  another medically diagnostic product or procedure.               [facility]            No: [id number]           [address] [place], [place] [address]           Lindon Romp, MD         [phone]   Result(s) reported on15 May 2015 at 10:47PM.  Routine Chem:  08-May-15 04:37   Glucose, Serum 75  BUN 9  Creatinine (comp) 0.82  Sodium, Serum  132  Potassium, Serum 4.0  Chloride, Serum 100  CO2, Serum 28  Calcium (Total), Serum  7.7  Osmolality (calc) 262  eGFR (African American) >60  eGFR (Non-African American) >60 (eGFR values <68m/min/1.73 m2 may be an indication of chronic kidney disease (CKD). Calculated eGFR is useful in patients with stable renal function. The eGFR calculation will not be reliable in acutely ill patients when serum creatinine is changing  rapidly. It is not useful in  patients on dialysis. The eGFR calculation may not be applicable to patients at the low and high extremes of body sizes, pregnant women, and  vegetarians.)  Anion Gap  4  Magnesium, Serum 1.9 (1.8-2.4 THERAPEUTIC RANGE: 4-7 mg/dL TOXIC: > 10 mg/dL  -----------------------)  09-May-15 06:05   Glucose, Serum 85  BUN 11  Creatinine (comp) 0.87  Sodium, Serum  135  Potassium, Serum 4.0  Chloride, Serum 101  CO2, Serum 28  Calcium (Total), Serum  7.5  Osmolality (calc) 269  eGFR (African American) >60  eGFR (Non-African American) >60 (eGFR values <36m/min/1.73 m2 may be an indication of chronic kidney disease (CKD). Calculated eGFR is useful in patients with stable renal function. The eGFR calculation will not be reliable in acutely ill patients when serum creatinine is changing rapidly. It is not useful in  patients on dialysis. The eGFR calculation may not be applicable to patients at the low and high extremes of body sizes, pregnant women, and vegetarians.)  Anion Gap  6  Magnesium, Serum 1.9 (1.8-2.4 THERAPEUTIC RANGE: 4-7 mg/dL TOXIC: > 10 mg/dL  -----------------------)  10-May-15 05:49   Glucose, Serum 66  BUN 10  Creatinine (comp) 0.84  Sodium, Serum 138  Potassium, Serum 4.0  Chloride, Serum 104  CO2, Serum 29  Calcium (Total), Serum  7.2  Osmolality (calc) 273  eGFR (African American) >60  eGFR (Non-African American) >60 (eGFR values <629mmin/1.73 m2 may be an indication of chronic kidney disease (CKD). Calculated eGFR is useful in patients with stable renal function. The eGFR calculation will not be reliable in acutely ill patients when serum creatinine is changing rapidly. It is not useful in  patients on dialysis. The eGFR calculation may not be applicable to patients at the low and high extremes of body sizes, pregnant women, and vegetarians.)  Anion Gap  5  13-May-15 04:22   Glucose, Serum 90  BUN 16  Creatinine (comp) 1.07  Sodium, Serum 139  Potassium, Serum 4.5  Chloride, Serum 105  CO2, Serum 29  Calcium (Total), Serum  8.0  Osmolality (calc) 278  eGFR (African American) >60   eGFR (Non-African American)  54 (eGFR values <6020min/1.73 m2 may be an indication of chronic kidney disease (CKD). Calculated eGFR is useful in patients with stable renal function. The eGFR calculation will not be reliable in acutely ill patients when serum creatinine is changing rapidly. It is not useful in  patients on dialysis. The eGFR calculation may not be applicable to patients at the low and high extremes of body sizes, pregnant women, and vegetarians.)  Anion Gap  5  Routine Hem:  13-May-15 04:22   Hemoglobin (CBC)  8.6  WBC (CBC) 6.3  RBC (CBC)  2.63  Hematocrit (CBC)  25.3  Platelet Count (CBC)  128  MCV 96  MCH 32.9  MCHC 34.2  RDW  17.8  Neutrophil % 67.3  Lymphocyte % 19.5  Monocyte % 10.7  Eosinophil % 1.8  Basophil % 0.7  Neutrophil # 4.2  Lymphocyte # 1.2  Monocyte # 0.7  Eosinophil # 0.1  Basophil # 0.0 (Result(s) reported on 19 Feb 2014 at 05:01AM.)    16:08   Hemoglobin (CBC)  8.1 (Result(s) reported on 19 Feb 2014 at 04:44PM.)  14-May-15 07:52   Hemoglobin (CBC)  8.1 (Result(s) reported on 20 Feb 2014 at 08:23AM.)   PERTINENT RADIOLOGY STUDIES: XRay:    06-May-15 09:25, Chest Portable Single View  Chest Portable Single View  REASON FOR EXAM:    wheezing, SOB, CP  COMMENTS:       PROCEDURE: DXR - DXR PORTABLE CHEST SINGLE VIEW  - Feb 12 2014  9:25AM     CLINICAL DATA:  Cough, wheezing, shortness of breath and chest pain.    EXAM:  PORTABLE CHEST - 1 VIEW    COMPARISON:  None.    FINDINGS:  The heart size and mediastinal contours are within normal limits.  Lung volumes are low. There is no evidence of pulmonary edema,  consolidation, pneumothorax, nodule or pleural fluid. The visualized  skeletal structures are unremarkable.     IMPRESSION:  Low lung volumes.  No active disease.      Electronically Signed    By: Aletta Edouard M.D.    On: 02/12/2014 09:34         Verified By: Azzie Roup, M.D.,  [place]:    06-May-15  14:24, [place] Color Flow Doppler Low Extrem Bilat (Legs)  [place] Color Flow Doppler Low Extrem Bilat (Legs)   REASON FOR EXAM:    swelling, r/o DVT  COMMENTS:       PROCEDURE: [place]  - US DOPPLER LOW EXTR BILATERAL  - Feb 12 2014  2:24PM     CLINICAL DATA:  Lower extremity swelling.  Rule out DVT.    EXAM:  BILATERAL LOWER EXTREMITY VENOUS DOPPLER ULTRASOUND    TECHNIQUE:  Gray-scale sonography with graded compression, as well as color  Doppler and duplex ultrasound were performed to evaluate the lower  extremity deep venous systems from the level of the common femoral  vein and including the common femoral, femoral, profunda femoral,  popliteal and calf veins including the posterior tibial, peroneal  and gastrocnemius veins when visible. The superficial great  saphenous vein was also interrogated. Spectral Doppler was utilized  to evaluate flow at restand with distal augmentation maneuvers in  the common femoral, femoral and popliteal veins.    COMPARISON:  None.    FINDINGS:  RIGHT LOWER EXTREMITY    Common Femoral Vein: No evidence of thrombus. Normal  compressibility, respiratory phasicity and response to augmentation.    Profunda Femoral Vein: No evidence of thrombus. Normal  compressibility and flow on color Doppler imaging.    Femoral Vein: No evidence of thrombus. Normal compressibility,  respiratory phasicity and response to augmentation.    Popliteal Vein: No evidence of thrombus. Normal compressibility,  respiratory phasicity and response to augmentation.    Calf Veins: No evidence of thrombus. Normal compressibility and flow  on color Doppler imaging.    Superficial Great Saphenous Vein: No evidence of thrombus.    Other Findings:  Subcutaneous edema.  LEFT LOWER EXTREMITY    Common Femoral Vein: No evidence of thrombus. Normal  compressibility, respiratory phasicity and response to augmentation.    Profunda Femoral Vein: Noevidence of thrombus.  Normal  compressibility and flow on color Doppler imaging.    Femoral Vein: No evidence of thrombus. Normal compressibility,  respiratory phasicity and response to augmentation.    Popliteal Vein: No evidence of thrombus. Normalcompressibility,  respiratory phasicity and response to augmentation.    Calf Veins: No evidence of thrombus. Normal compressibility and flow  on color Doppler imaging.    Superficial Great Saphenous Vein: The proximal great saphenous vein  is patent. Short-segment of the great saphenous vein at the knee  contains thrombus. The thrombus extends into a branch of the left  great saphenous vein within the left calf.  Other Findings:  There is diffuse subcutaneous edema.     IMPRESSION:  No evidence of deep venous thrombosis.    Left lower extremity superficial thrombophlebitis involving a short  segment of the left great saphenous vein and a branch in the left  calf.  Diffuse subcutaneous edema.      Electronically Signed    By: Markus Daft M.D.    On: 02/12/2014 14:56         Verified By: Burman Riis, M.D.,    06-May-15 14:55, US Guided Paracentesis  US Guided Paracentesis   REASON FOR EXAM:    ascites, sob, LE swelling  COMMENTS:       PROCEDURE: [place]  - US GUIDED PARACENTESIS  - Feb 12 2014  2:55PM     CLINICAL DATA:  Ascites    EXAM:  ULTRASOUND GUIDED PARACENTESIS    PROCEDURE:  An ultrasound guided paracentesis was thoroughly discussed with the  patient and questions answered. The benefits, risks, alternatives  and complications were also discussed. The patient understands and  wishes to proceed with the procedure. Written consent was obtained.  Ultrasound was performed to localize and mark an adequate pocket of  fluid in the right upper quadrant of the abdomen. The area was then  prepped and draped in the normal sterile fashion. 1% Lidocaine was  used for local anesthesia. Under ultrasound guidance, a 6 French  paracentesis catheter  was introduced. Paracentesis was performed.  The catheter was removed and a dressing applied.    FINDINGS:  A total of approximately 6.5 L of clear yellow fluid was removed. A  fluid sample wassent for laboratory analysis.     IMPRESSION:  Successful ultrasound guided paracentesis yielding 6.5 L of ascites.    Electronically Signed    By: Inez Catalina M.D.    On: 02/12/2014 15:29         Verified By: Everlene Farrier, M.D.,    06-May-15 16:30, US Abdomen General Survey  US Abdomen General Survey   REASON FOR EXAM:    ascites, abnormal LFT's,  Abdominal Pain  COMMENTS:       PROCEDURE: [place]  - US ABDOMEN GENERAL SURVEY  - Feb 12 2014  4:30PM     CLINICAL DATA:  Ascites with abnormal LFT.  Abdominal pain    EXAM:  ULTRASOUND ABDOMEN COMPLETE    COMPARISON:  None.    FINDINGS:  Gallbladder:  Gallstones are present. Gallbladder wall is markedly thickened  measuring approximately 9 mm. Increased echogenicity of the bile  suggesting sludge or hemorrhage. Negative sonographic Murphy sign.    Commonbile duct:    Diameter: 2.8 mm    Liver:    Small liver with heterogeneous echotexture and nodular capsule  suggestive of cirrhosis. No focal liver mass.    IVC:  No abnormality visualized.    Pancreas:    Nonvisualized    Spleen:    Size and appearance within normal limits.    Right Kidney:    Length: 10.1 cm. Echogenicity within normal limits. No mass or  hydronephrosis visualized.  Left Kidney:    Length: 10.4 cm. Echogenicity within normal limits. No mass or  hydronephrosis visualized.    Abdominal aorta:    No aneurysm visualized.    Other findings:    Moderate amount of ascites. The patient had paracentesis preceding  the study.     IMPRESSION:  Gallstones. Thickened gallbladder wall. Echogenic bile suggestive of  sludge or biliary hemorrhage.  Negative sonographic Murphy sign.    Findings consistent with cirrhosis and ascites.      Electronically  Signed    By: Franchot Gallo M.D.    On: 02/12/2014 16:44         Verified By: Truett Perna, M.D.,    08-May-15 16:14, US Guided Paracentesis  US Guided Paracentesis   REASON FOR EXAM:    ascites  COMMENTS:       PROCEDURE: [place]  - US GUIDED PARACENTESIS  - Feb 14 2014  4:14PM     INDICATION:  Recurrent symptomatic ascites    EXAM:  ULTRASOUND-GUIDED PARACENTESIS    COMPARISON:  US ABDOMEN COMPLETE dated 02/12/2014; USGUIDE NEEDLE -  [place] PARA dated 02/12/2014    MEDICATIONS:  None.    COMPLICATIONS:  None immediate    TECHNIQUE:  Informed written consent was obtained from the patient (via the use  of a medical translator) after a discussion of the risks, benefits  and alternatives to treatment. A timeout was performed prior to the  initiation of the procedure.    Initial ultrasound scanning demonstrates a small amount of ascites  within the right lower abdominal quadrant. The right lower abdomen  was prepped and draped in the usual sterile fashion. 1% lidocaine  with epinephrine was used for local anesthesia. An ultrasound image  was saved for documentation purposed. An 8 Fr Safe-T-Centesis  catheter was introduced. The paracentesis was performed. The  catheter was removed and a dressing was applied. The patient  tolerated the procedure well without immediate post procedural  complication.    FINDINGS:  A total of approximately 2 liters of serous fluid was removed.     IMPRESSION:  Successful ultrasound-guided paracentesis yielding 2 liters of  peritoneal fluid.      Electronically Signed    By: Sandi Mariscal M.D.    On: 02/14/2014 16:19         Verified By: Aileen Fass, M.D.,  LabUnknown:    06-May-15 13:20, Body Fluid Nucleated Cell Count, Diff  Result Interpretation   Smear of peritoneal fluid reveals mixed inflammation with  predominance of white blood cells. Correlation with culture  results is advised. Negative for malignancy. T.Rubinas MD.     08-May-15 16:14, US Guided Paracentesis  PACS Image   CT:    09-May-15 15:22, CT Abdomen and Pelvis With Contrast  CT Abdomen and Pelvis With Contrast   REASON FOR EXAM:    (1) abdominal pain; (2) abdominal pain  COMMENTS:       PROCEDURE: CT  - CT ABDOMEN / PELVIS  W  - Feb 15 2014  3:22PM     CLINICAL DATA:  Abdominal pain and tenderness. Ascites. Recent  hernia repair.    EXAM:  CT ABDOMEN AND PELVIS WITH CONTRAST    TECHNIQUE:  Multidetector CT imaging of the abdomen and pelvis was performed  using the standard protocol following bolus administration of  intravenous contrast.  CONTRAST:  100 mL Isovue 300    COMPARISON:  Ultrasound on 02/12/2014    FINDINGS:  Hepatic cirrhosis demonstrated, however no liver masses are  identified. Prominent esophageal varices are seen, consistent with  portal venous hypertension. No evidence of splenomegaly. Moderate  ascites is seen as well as diffuse body wall edema.    Calcified gallstones are noted, however there is no evidence of  acute cholecystitis or biliary dilatation. The pancreas, spleen,  adrenal glands, and kidneys are normal in appearance. No evidence  of  hydronephrosis.  Uterus and adnexal regions are unremarkable. No soft tissue masses  or lymphadenopathy identified within the abdomen or pelvis. No  evidence of focal inflammatory process or abscess. No evidence of  dilated bowel loops.     IMPRESSION:  Hepatic cirrhosis with prominent esophageal varices, consistent with  portal venous hypertension.    Moderate ascites and diffuse body wall edema. No evidence of  splenomegaly.    Cholelithiasis.  No radiographic evidence of cholecystitis.    No soft tissue masses or lymphadenopathy identified within the  abdomen or pelvis.      Electronically Signed    By: Earle Gell M.D.    On: 02/15/2014 15:28         Verified By: Marlaine Hind, M.D.,   Pertinent Past History:  Pertinent Past History PAST MEDICAL  HISTORY: Significant for history of liver cirrhosis, kidney insufficiency, hypothyroidism, also gallstones on ultrasound, umbilical hernia repair with mesh placed, as well as anemia.   Hospital Course:  Hospital Course 80 f with cirrhosis of unknown etiology here with ascitis.  * Vomiting Likely from the liver disease/varices. Symptomatic treatment  * AOCD Hb at 8.1 Does have Esophageal varices but Hemoccult negative.  * Liver cirrhosis/ ascites - Unknown etiology. - s/p 2 paracentesis with a total of 8.5 liters taken off already. - Will likely need OP paracentesis - Bumex, spironolactone. - iv albumin - Cipro for SBP prophylaxis Poor prognosis Seems to be diuresing well trending her weight. 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 Gustavo Lah.  Time sepnt on day of d/c 40 minutes   Condition on Discharge Fair   Code Status:  Code Status Full Code   DISCHARGE INSTRUCTIONS HOME MEDS:  Medication Reconciliation: Patient's Home Medications at Discharge:     Medication Instructions  spironolactone 50 mg oral tablet  1 tab(s) orally once a day   iron  1 tab(s) orally once a day   multivitamin with phospholipids  1 tab(s) orally once a day   furosemide 40 mg oral tablet  1.5 tab(s) orally 2 times a day   oxycodone 5 mg oral tablet  1 tab(s) orally every 8 hours, As Needed, pain , As needed, pain   docusate sodium 100 mg oral capsule  1 cap(s) orally 2 times a day   polyethylene glycol 3350 oral powder for reconstitution  17 gram(s) orally once a day - for Constipation   midodrine 10 mg oral tablet  1 tab(s) orally 3 times a day   ciprofloxacin 500 mg oral tablet  1 tab(s) orally 2 times a  week on Monday and Thursday   prilosec otc 20 mg oral delayed release tablet  1 tab(s) orally once a day   levothyroxine 100 mcg (0.1 mg) oral tablet  1 tab(s) orally once a day   rifaximin 550 mg oral tablet  1 tab(s) orally 2 times a day   promethazine 25 mg oral tablet  1 tab(s) orally 4 times a day, As Needed - for Nausea, Vomiting     Physician's Instructions:  Diet Low Sodium  Daily fluids < 2 liters   Activity Limitations As tolerated   Return to Work  Not Applicable   Time frame for Follow Up Appointment 1-2 weeks  Dr. Gustavo Lah   Time frame for Follow Up Appointment 1-2 weeks  Hepatology clinic [facility]   Electronic Signatures: Margrete Delude, Lottie Dawson (MD)  (Signed 19-May-15 10:38)  Authored: ADMISSION DATE AND DIAGNOSIS, CHIEF COMPLAINT/HPI, Allergies, PERTINENT LABS, PERTINENT RADIOLOGY STUDIES, [place] MEDS, PATIENT INSTRUCTIONS   Last Updated: 19-May-15 10:38 by Alba Destine (MD)

## 2015-01-31 NOTE — H&P (Signed)
PATIENT NAME:  Emily Reese, Shamekia D MR#:  [id number] DATE OF BIRTH:  1945/10/26  DATE OF ADMISSION:  02/12/2014  PRIMARY CARE PHYSICIAN:  None.  PRIMARY CARE PHYSICIAN: The patient is a 69 year old [place] female who just came from of [place] with worsening abdominal distention. Apparently, the patient was diagnosed with liver cirrhosis in [place] approximately 8 months ago; however, now over the past 8 months, she has been having problems with worsening lower extremity swelling, as well as abdominal distention. She also is complaining of chest pain for the past few months. The patient describes this pain as midsternal discomfort, constant, which increases especially at nighttime accompanied by shortness of breath. On arrival to the Emergency Room, she was noted to have a distended abdomen, very tense, ascitics, and hospitalist services were contacted for admission.   PAST MEDICAL HISTORY: Significant for history of liver cirrhosis, kidney insufficiency, hypothyroidism, also gallstones on ultrasound, umbilical hernia repair with mesh placed, as well as anemia.   MEDICATIONS: Lasix 40 mg p.o. twice daily, spironolactone 50 mg p.o. daily, Synthroid 100 mcg p.o. daily, hematogen which is an iron supplement daily, as well as multivitamins.   ALLERGIES: None.  FAMILY HISTORY: Significant for diabetes mellitus in the patient's sister. The patient's father died of infection. The patient's mother died of fall and injury. No known cancers or early coronary disease or strokes.   SOCIAL HISTORY: The patient is widowed. She has 3 children. Her husband died a few years ago. Her children are adult, and she also has grandchildren. She did not smoke. Drank alcohol occasionally socially only in the past, but nothing recently; and no significant alcohol use in the past. Occupation: Domestic work.   REVIEW OF SYSTEMS: Positive for fatigue, weakness, pains in the abdomen, pains in the chest, some cough,  some phlegm production for the past 1 week, also shortness of breath and orthopnea, few-pillow orthopnea, and also PND, feeling jaundiced, also has some sinus congestion, having intermittent nausea, as well as dry heaving and diarrhea for the past 4 days,  also black stools for the past 8 days, and some bruising in her skin, a burning sensation whenever she urinates. Denies any high fevers or chills. Admits to fatigue. Denies any weight loss. Admits of weight gain due to abdominal distention and swelling.  EYES: Denies any blurry vision, double vision, glaucoma or cataracts.  ENT: Denies any tinnitus, allergies, epistaxis, sinus pain, dentures, difficulty swallowing.  RESPIRATORY: The patient denies any hemoptysis, asthma or COPD.  CARDIOVASCULAR: Denies any arrhythmias.  Admits to having some palpitations intermittently.  Denies any syncopal episodes.  No hematemesis.  GASTROINTESTINAL: Rectal bleeding, change in bowel habits.  GENITOURINARY: Denies dysuria, hematuria, frequency, incontinence.  ENDOCRINE: Denies any polydipsia, nocturia, thyroid problems, heat or cold intolerance or thirst.  HEMATOLOGIC: Denies anemia, easy bruising, bleeding or swollen glands.  SKIN: Denies any acne, rashes, or change in moles.  MUSCULOSKELETAL: Denies arthritis, swelling, gout  NEUROLOGICAL:  Denies epilepsy or tremor.  PSYCHIATRIC: Denies anxiety, insomnia or depression.   PHYSICAL EXAMINATION: VITAL SIGNS: On arrival to the hospital, the patient's vital signs: Temperature was 98.3, pulse was 75, respiratory rate was 28, blood  pressure 98/65, saturation was 97% on room air.  GENERAL: This is a well-developed, well-nourished very uncomfortable Spanish female lying on the stretcher.  HEENT: Her pupils are equal and reactive to light. Extraocular movements are intact.  No icterus or conjunctivitis. Normal hearing. No pharyngeal erythema. Mucosa is moist.  NECK: No masses, supple,  nontender. Thyroid not enlarged.   No adenopathy.  No JVD or carotid bruits bilaterally. Full range of motion.  LUNGS: Crackles at the bases as well as diminished breath sounds at bases. A few rhonchi were heard, but no wheezing. The patient does have labored inspirations, especially whenever she lies down; she has increased effort to breathe. She is in respiratory distress as soon as she moves in the bed. She cannot take a deep breath due to severe distention of her abdomen, severe tense ascites.  CARDIOVASCULAR: S1, S2 appreciated. No murmurs, gallops, or rubs. The rhythm is regular. PMI not lateralized. Chest is nontender to palpation.  EXTREMITIES: Reveal 1+ pedal pulses. Severe lower extremity edema and induration was noted extending to the sacral area.  ABDOMEN: Protuberant, moderately firm, diffusely tender to palpation. I am not able to assess for hepatosplenomegaly due to severe distention of abdomen.  RECTAL: Deferred.  MUSCLE STRENGTH: Able to move all extremities. No cyanosis, degenerative joint disease, or kyphosis. Gait was not tested.  SKIN: Skin did not reveal any rashes, lesions, erythema, nodularity. Patient  had a few bruises in her upper extremities, induration of lower extremities as well.  LYMPHATIC: No adenopathy in the cervical region.  NEUROLOGICAL: Cranial nerves grossly intact. Sensory is intact. No dysarthria or aphasia. The patient is alert, cooperative; however, her memory seemed to be impaired. She is slightly confused; however, difficult to assess her since all interpretation is made via [place] interpreter.   LABORATORY DATA: BMP revealed sodium 130; otherwise BMP was unremarkable. The patient's calcium level was low at 7.6. Lipid panel: LDL was 160, cholesterol was 207, triglycerides 106, HDL was low at 26, a lipase level of 237. Liver enzymes: Total protein of 5.6, albumin level of 1.1, total bilirubin of 1.3, alkaline phosphatase 130, AST of 47, troponin less than 0.02. TSH was high at 31.6. White blood  cell count was 5.7, hemoglobin was 11.7, and platelet count was 166. Absolute neutrophil count was normal at 3.8. Coagulation panel: Pro time was 16.1, INR was 1.3, and activated PTT 39.2.   RADIOLOGIC STUDIES: Chest x-ray, portable single view, sixth of May 2015, revealed low lung volumes and no active disease. EKG revealed normal sinus rhythm at 67 beats per minute, left axis deviation, low voltage QRS, but and no acute ST-T changes were noted. T depressions were noted, however, in inferior leads were noted. No EKG to compare with.    ASSESSMENT AND PLAN: 1.  Severe ascites. Admit the patient to the medical floor. Get ultrasound guided  paracentesis to help with his breathing as well as discomfort and pain. Get labs, fluid labs as well as cytology; and get echocardiogram also done, as well as Doppler ultrasound to rule out deep vein thrombosis.  2.  Liver cirrhosis of unclear etiology at this time. We will get hepatitis panel. We will get a gastroenterologist involved for further recommendations.  3.  Hypotension. The patient likely will need to some albumin infusions. We will start, however, her on midodrine for now.  4.  Hypothyroidism. Continue Synthroid at 100 mcg p.o. daily dose. TSH is high, unfortunately.  5.  Chest pain with abnormal EKG. We will be get cardiac enzymes x3. Unfortunately, we are not going to use beta blockers due to hypotension, or nitroglycerin due to hypotension. We will not be able to initiate aspirin at this time because of concerns of bleeding.  6.  Anemia. Get guaiac. The patient reports black stools. Need to rule out that she does  not bleed. We will get a gastrointestinal involved for further recommendations. We may be able to start her on nadolol if her blood pressure tolerates.   TIME SPENT: One hour on admit.    ____________________________ Katharina Caper, MD rv:bw D: 02/12/2014 12:11:24 ET T: 02/12/2014 14:38:43 ET JOB#: [id number]  cc: Katharina Caper, MD,  <Dictator> Alyssha Housh MD ELECTRONICALLY SIGNED 03/25/2014 7:23

## 2015-01-31 NOTE — Consult Note (Signed)
Chief Complaint:  Subjective/Chief Complaint seen for cirrhosis. c/o nausea today, threw upt the lactulose she was given earlier today.   no abdominal pain.  No bm since 5/8, verified by patietn and nursing.  Report of colored emesis likely the lactulose.  Other episodes of emesis are verified with nursing and patient to be "phlegm", white, frothy mucus.   VITAL SIGNS/ANCILLARY NOTES: **Vital Signs.:   13-May-15 13:02  Vital Signs Type Routine  Temperature Temperature (F) 98.1  Celsius 36.7  Pulse Pulse 45  Respirations Respirations 20  Systolic BP Systolic BP 97  Diastolic BP (mmHg) Diastolic BP (mmHg) 59  Mean BP 71  Pulse Ox % Pulse Ox % 94  Pulse Ox Activity Level  At rest  Oxygen Delivery Room Air/ 21 %   Brief Assessment:  Cardiac Regular   Respiratory clear BS   Gastrointestinal details normal positive ascites, bs positive, non-tender.   Additional Physical Exam DRE-heme negative, pale brown pasty stool; positive external hemorrhoids.   Lab Results:  Hepatic:  13-May-15 04:22   Bilirubin, Total  1.2  Alkaline Phosphatase 68 (45-117 NOTE: New Reference Range 08/30/13)  SGPT (ALT) 15  SGOT (AST) 33  Total Protein, Serum  4.8  Albumin, Serum  2.7  Routine Chem:  13-May-15 04:22   Glucose, Serum 90  BUN 16  Creatinine (comp) 1.07  Sodium, Serum 139  Potassium, Serum 4.5  Chloride, Serum 105  CO2, Serum 29  Calcium (Total), Serum  8.0  Osmolality (calc) 278  eGFR (African American) >60  eGFR (Non-African American)  54 (eGFR values <57m/min/1.73 m2 may be an indication of chronic kidney disease (CKD). Calculated eGFR is useful in patients with stable renal function. The eGFR calculation will not be reliable in acutely ill patients when serum creatinine is changing rapidly. It is not useful in  patients on dialysis. The eGFR calculation may not be applicable to patients at the low and high extremes of body sizes, pregnant women, and vegetarians.)  Anion  Gap  5  Routine Hem:  13-May-15 04:22   Hemoglobin (CBC)  8.6  WBC (CBC) 6.3  RBC (CBC)  2.63  Hematocrit (CBC)  25.3  Platelet Count (CBC)  128  MCV 96  MCH 32.9  MCHC 34.2  RDW  17.8  Neutrophil % 67.3  Lymphocyte % 19.5  Monocyte % 10.7  Eosinophil % 1.8  Basophil % 0.7  Neutrophil # 4.2  Lymphocyte # 1.2  Monocyte # 0.7  Eosinophil # 0.1  Basophil # 0.0 (Result(s) reported on 19 Feb 2014 at 05:01AM.)    16:08   Hemoglobin (CBC)  8.1 (Result(s) reported on 19 Feb 2014 at 04:44PM.)   Assessment/Plan:  Assessment/Plan:  Assessment 1) cirrhosis of uncertain etiology, h/o caused by medication use per patient.   2) patient shows no evidence to support GI bleeding of a significant amount-bun normal, DRE heme negative. no recorded or verified hematemesis or melena.  3) constipation-did not tolerate lactulose. "I threw up the red medicine" 4) drop of hgb-patient with 3# weight gain over 24 hours-question of possible dilutional.   Plan 1) d/c lactulose, will start miralax for constipation.  2) continue current diuretics, I have again discussed need for accurate I/O and daily weights with nursing staff as well as detailed reports of any episode of emesis or bm.  3) If patient family is wanting to pursue evaluation or possibility of liver transplans, I recommend further workup at [facility] institution[facility]  Electronic Signatures for Addendum Section:  Loistine Simas (MD) (Signed Addendum 13-May-15 17:56)  will restart xifaxan and check for h pylori via serology   Electronic Signatures: Loistine Simas (MD)  (Signed 13-May-15 17:45)  Authored: Chief Complaint, VITAL SIGNS/ANCILLARY NOTES, Brief Assessment, Lab Results, Assessment/Plan   Last Updated: 13-May-15 17:56 by Loistine Simas (MD)

## 2015-12-24 IMAGING — CT CT ABD-PELV W/ CM
2 of 5 series · 17 of 46 positions shown, 19 images · IV contrast (isovue)
Comparison: Ultrasound on 02/12/2014

CLINICAL DATA: Abdominal pain and tenderness. Ascites. Recent
hernia repair.

EXAM:
CT ABDOMEN AND PELVIS WITH CONTRAST
TECHNIQUE: Multidetector CT imaging of the abdomen and pelvis was performed
using the standard protocol following bolus administration of
intravenous contrast.
CONTRAST:  100 mL Isovue 300

[Series 2: routine abd pel with · axial · 0.96mm/px · z∈[-1266,-901]mm · 14 of 83 slices shown, 16 images]
[im 5/83  soft-tissue]
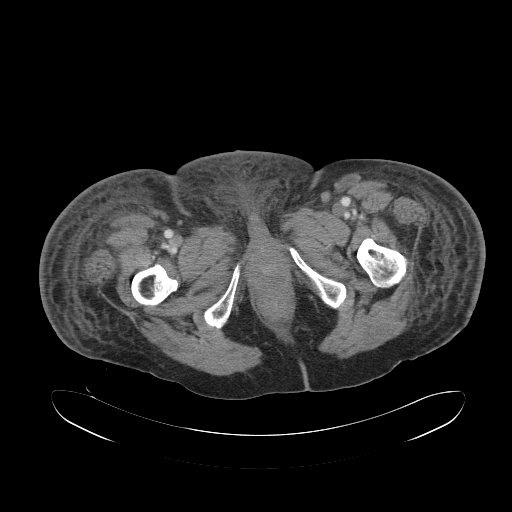
[im 5/83  bone]
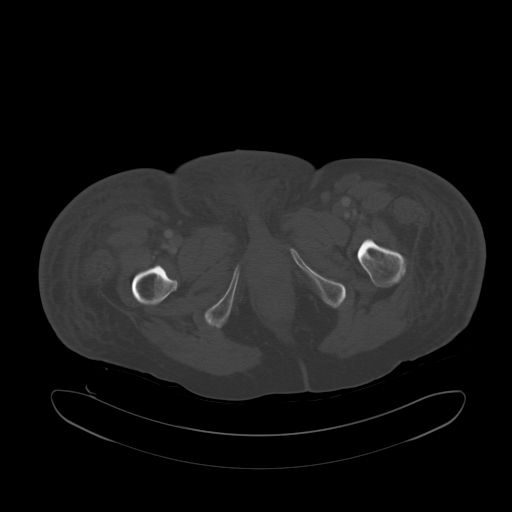
[im 9/83  soft-tissue]
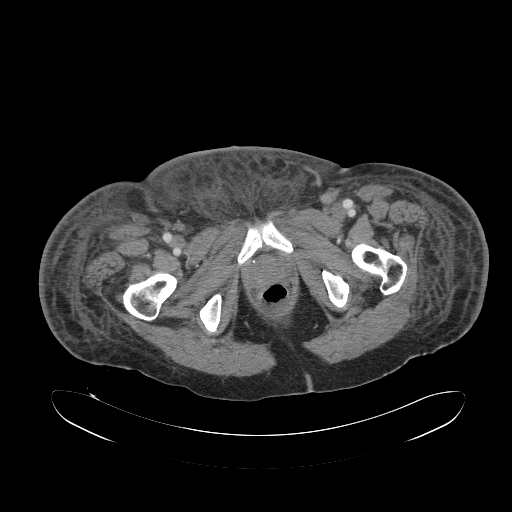
[im 18/83  soft-tissue]
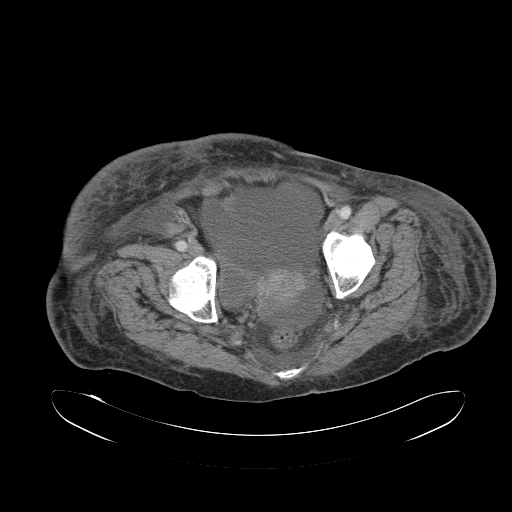
[im 22/83  soft-tissue]
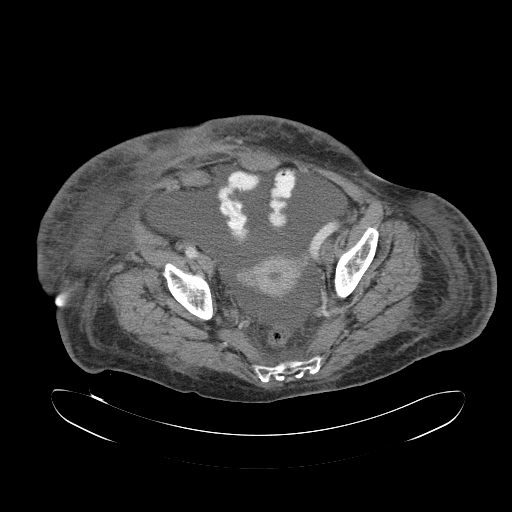
[im 26/83  soft-tissue]
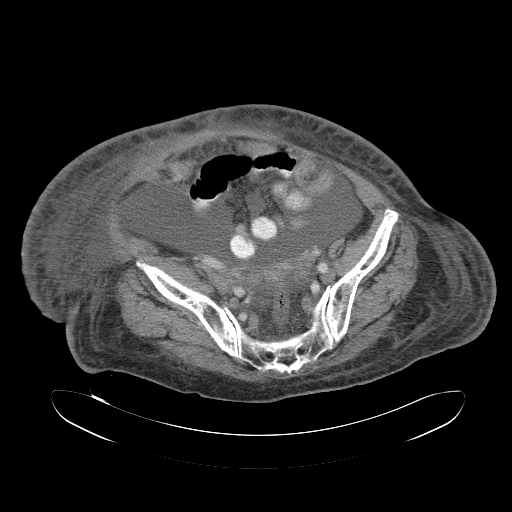
[im 35/83  soft-tissue]
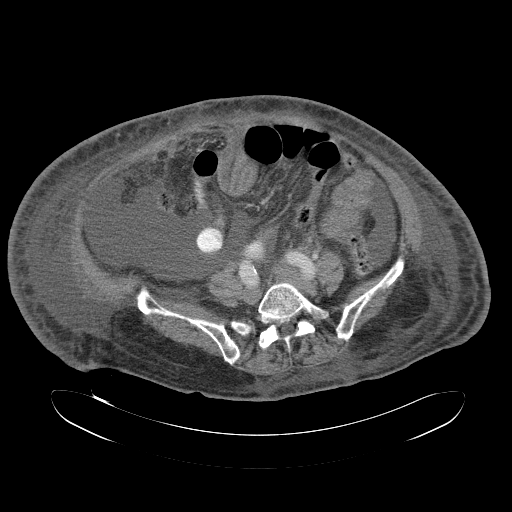
[im 39/83  soft-tissue]
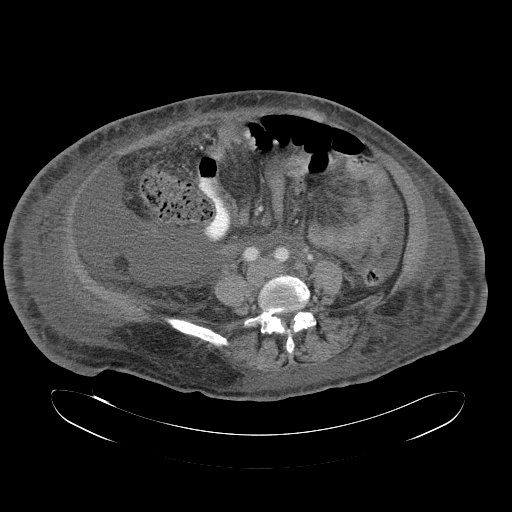
[im 44/83  soft-tissue]
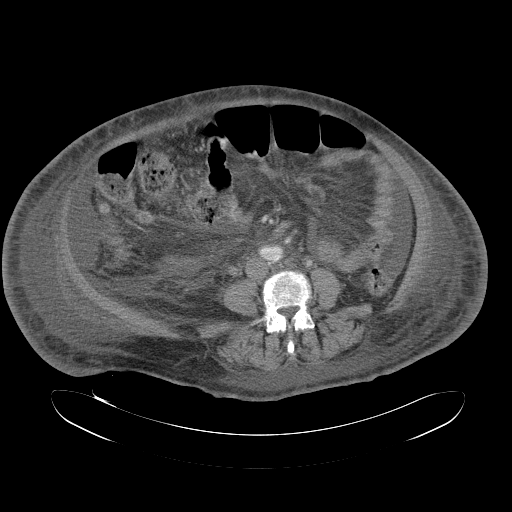
[im 48/83  soft-tissue]
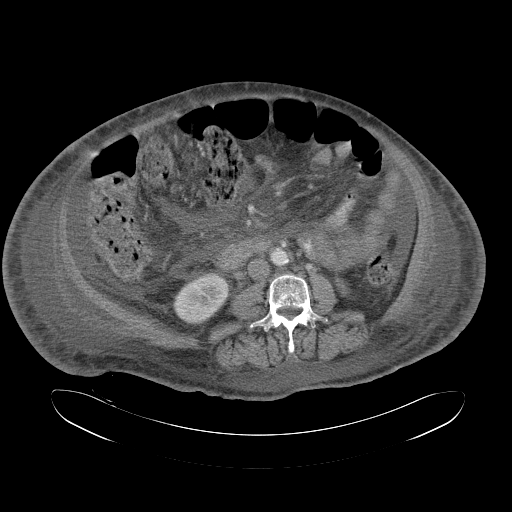
[im 48/83  bone]
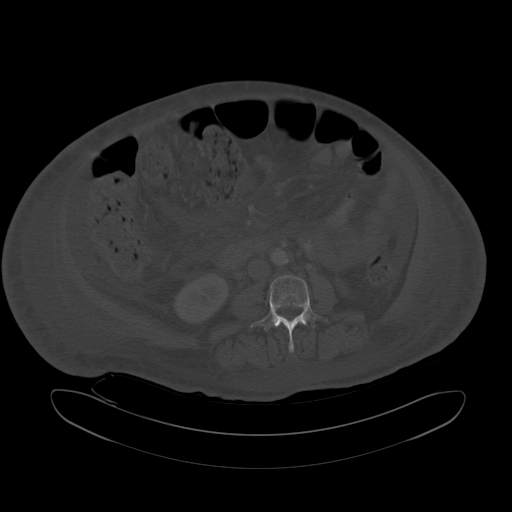
[im 57/83  soft-tissue]
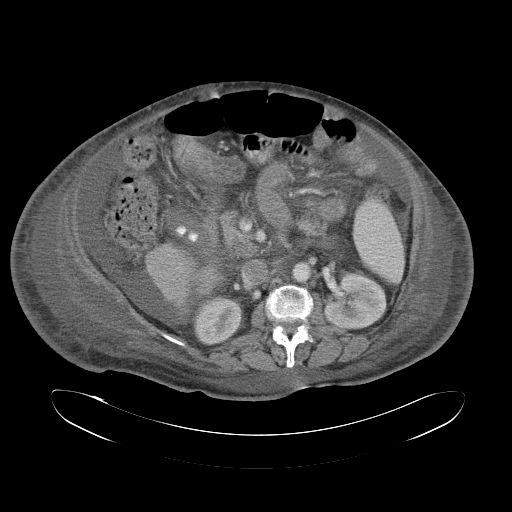
[im 61/83  soft-tissue]
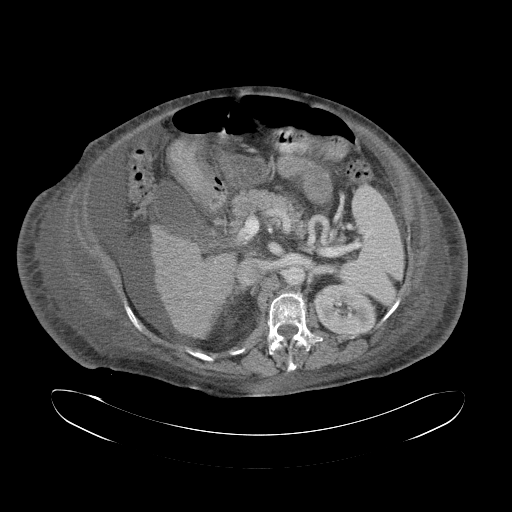
[im 65/83  soft-tissue]
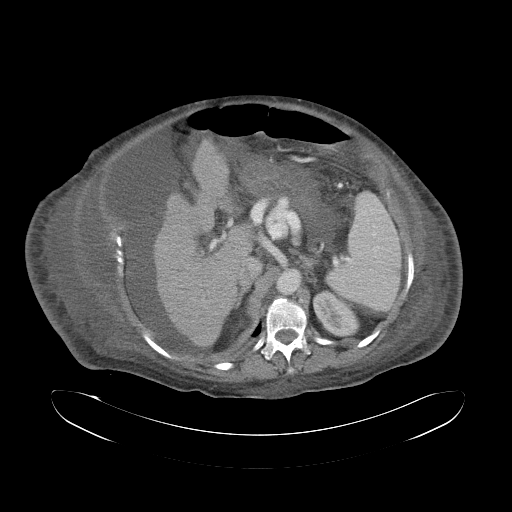
[im 74/83  soft-tissue]
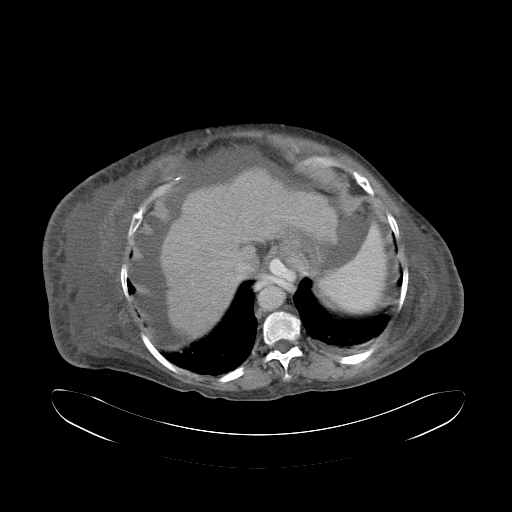
[im 78/83  soft-tissue]
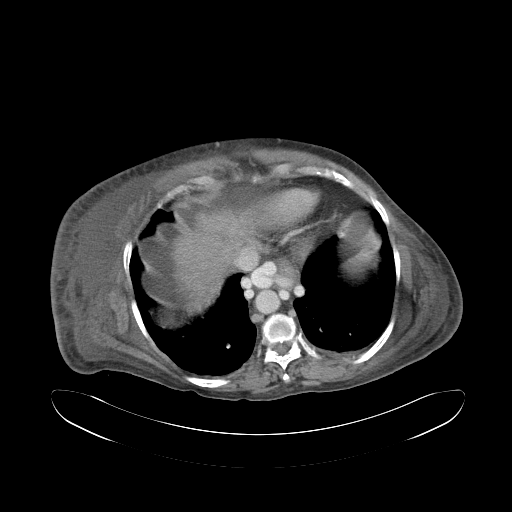

[Series 5: cor routine abd pel with · coronal · 0.89mm/px · 3 of 154 slices shown]
[im 52/154  soft-tissue]
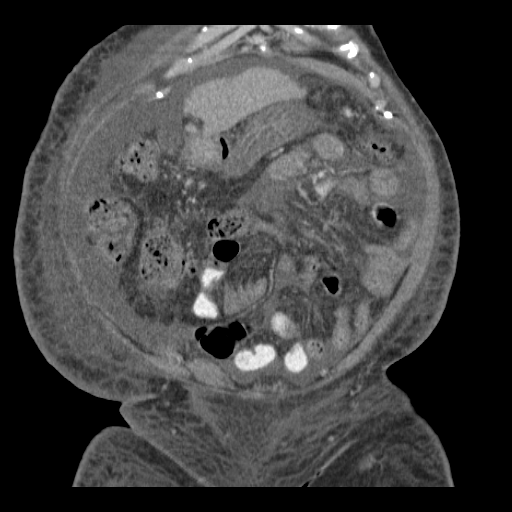
[im 69/154  soft-tissue]
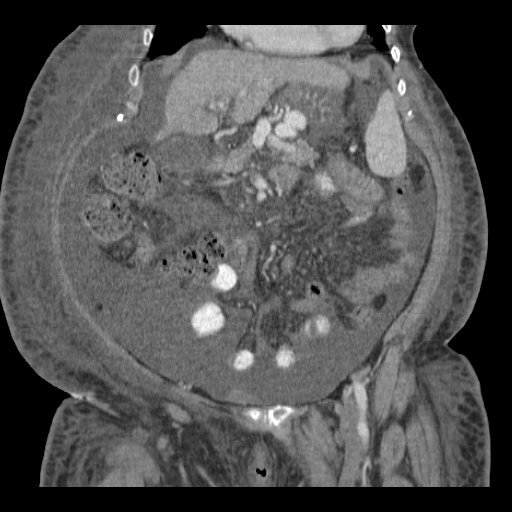
[im 86/154  soft-tissue]
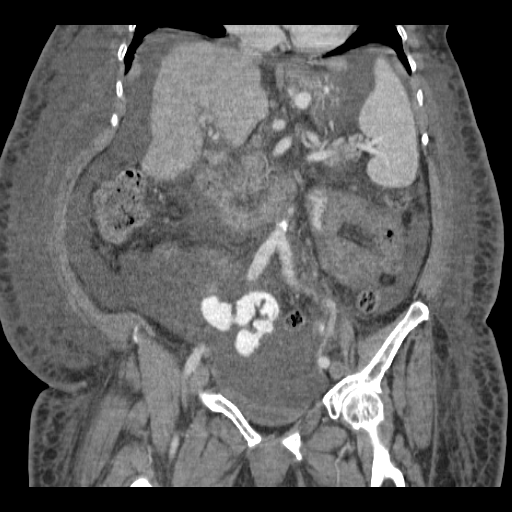

[17 of 46 positions shown; findings below may reference images not displayed]

FINDINGS: Hepatic cirrhosis demonstrated, however no liver masses are
identified. Prominent esophageal varices are seen, consistent with
portal venous hypertension. No evidence of splenomegaly. Moderate
ascites is seen as well as diffuse body wall edema.

Calcified gallstones are noted, however there is no evidence of
acute cholecystitis or biliary dilatation. The pancreas, spleen,
adrenal glands, and kidneys are normal in appearance. No evidence of
hydronephrosis.

Uterus and adnexal regions are unremarkable. No soft tissue masses
or lymphadenopathy identified within the abdomen or pelvis. No
evidence of focal inflammatory process or abscess. No evidence of
dilated bowel loops.
IMPRESSION: Hepatic cirrhosis with prominent esophageal varices, consistent with
portal venous hypertension.

Moderate ascites and diffuse body wall edema. No evidence of
splenomegaly.

Cholelithiasis.  No radiographic evidence of cholecystitis.

No soft tissue masses or lymphadenopathy identified within the
abdomen or pelvis.
